# Patient Record
Sex: Male | Born: 1995 | Race: Black or African American | Hispanic: No | Marital: Single | Smoking: Never smoker
Health system: Southern US, Community
[De-identification: ages and names within clinical notes are randomized; demographics above are authoritative.]

## PROBLEM LIST (undated history)

## (undated) DIAGNOSIS — T7840XA Allergy, unspecified, initial encounter: Secondary | ICD-10-CM

## (undated) HISTORY — PX: TONSILLECTOMY: SUR1361

## (undated) HISTORY — DX: Allergy, unspecified, initial encounter: T78.40XA

## (undated) HISTORY — PX: ADENOIDECTOMY: SUR15

## (undated) HISTORY — PX: FOOT SURGERY: SHX648

---

## 2016-10-12 ENCOUNTER — Emergency Department (HOSPITAL_COMMUNITY): Payer: Self-pay

## 2016-10-12 ENCOUNTER — Encounter (HOSPITAL_COMMUNITY): Payer: Self-pay | Admitting: Family Medicine

## 2016-10-12 ENCOUNTER — Emergency Department (HOSPITAL_COMMUNITY)
Admission: EM | Admit: 2016-10-12 | Discharge: 2016-10-13 | Disposition: A | Discharge status: Home / Self Care | Payer: Self-pay | Attending: Emergency Medicine | Admitting: Emergency Medicine

## 2016-10-12 DIAGNOSIS — J069 Acute upper respiratory infection, unspecified: Secondary | ICD-10-CM | POA: Insufficient documentation

## 2016-10-12 LAB — RAPID STREP SCREEN (MED CTR MEBANE ONLY): STREPTOCOCCUS, GROUP A SCREEN (DIRECT): NEGATIVE

## 2016-10-12 MED ORDER — KETOROLAC TROMETHAMINE 30 MG/ML IJ SOLN
15.0000 mg | Freq: Once | INTRAMUSCULAR | Status: AC
  Administered 2016-10-13: 15 mg via INTRAMUSCULAR
  Filled 2016-10-12: qty 1

## 2016-10-12 NOTE — ED Triage Notes (Signed)
Pt reports he has had a sore throat since Saturday and rash on his upper lip started today. Also, reports fever (102.0 oral)  at home. Took OTC cough drops, throat spray, and cold medication.

## 2016-10-12 NOTE — ED Provider Notes (Signed)
[facility]-EMERGENCY DEPT Provider Note   CSN: [id number] Arrival date & time: 10/12/16  2231   By signing my name below, I, Teofilo Pod, attest that this documentation has been prepared under the direction and in the presence of Shon Baton, MD . Electronically Signed: Teofilo Pod, ED Scribe. 10/12/2016. 11:20 PM.   History   Chief Complaint Chief Complaint  Patient presents with  . Sore Throat  . Rash    The history is provided by the patient. No language interpreter was used.   HPI Comments:  Steven Franco is a 20 y.o. male who presents to the Emergency Department complaining of a constant sore throat x 4 days. Pt reports that he feels like his throat is closing up and that he is having SOB. Pt complains of associated fever of 102, diaphoresis at night, and rhinorrhea. Pt also complains of a rash around his mouth. Pt states that he has had similar fever blisters previously. Pt reports PSHx of tonsillectomy Pt took Robitussin DM, cough spray, and cough drops with no relief. Pt denies nausea, vomiting, ear pain.    History reviewed. No pertinent past medical history.  There are no active problems to display for this patient.   Past Surgical History:  Procedure Laterality Date  . ADENOIDECTOMY    . FOOT SURGERY Left   . TONSILLECTOMY         Home Medications    Prior to Admission medications   Medication Sig Start Date End Date Taking? Authorizing Provider  ibuprofen (ADVIL,MOTRIN) 600 MG tablet Take 1 tablet (600 mg total) by mouth every 6 (six) hours as needed. 10/13/16   Shon Baton, MD    Family History History reviewed. No pertinent family history.  Social History Social History  Substance Use Topics  . Smoking status: Never Smoker  . Smokeless tobacco: Never Used  . Alcohol use Yes     Comment: Every other week      Allergies   Review of patient's allergies indicates no known allergies.   Review of Systems Review of Systems    Constitutional: Positive for diaphoresis and fever.  HENT: Positive for rhinorrhea and sore throat. Negative for ear pain.   Gastrointestinal: Negative for nausea and vomiting.  Skin: Positive for rash.  All other systems reviewed and are negative.    Physical Exam Updated Vital Signs BP 127/58 (BP Location: Right Arm)   Pulse 71   Temp 98.5 F (36.9 C) (Oral)   Resp 18   Ht 5\' 6"  (1.676 m)   Wt 150 lb (68 kg)   SpO2 100%   BMI 24.21 kg/m   Physical Exam  Constitutional: He is oriented to person, place, and time. He appears well-developed and well-nourished. No distress.  HENT:  Head: Normocephalic and atraumatic.  Fever blisters noted about the mouth, status post tonsillectomy, erythema posterior oropharynx, uvula midline  Eyes: Pupils are equal, round, and reactive to light.  Neck: Normal range of motion. Neck supple.  Cardiovascular: Normal rate, regular rhythm and normal heart sounds.   No murmur heard. Pulmonary/Chest: Effort normal and breath sounds normal. No respiratory distress. He has no wheezes.  Abdominal: Soft. Bowel sounds are normal. There is no tenderness. There is no rebound.  Musculoskeletal: He exhibits no edema.  Neurological: He is alert and oriented to person, place, and time.  Skin: Skin is warm and dry.  Psychiatric: He has a normal mood and affect.  Nursing note and vitals reviewed.  ED Treatments / Results  DIAGNOSTIC STUDIES:  Oxygen Saturation is 100% on RA, normal by my interpretation.    COORDINATION OF CARE:  11:20 PM Discussed treatment plan with pt at bedside and pt agreed to plan.   Labs (all labs ordered are listed, but only abnormal results are displayed) Labs Reviewed  [facility]EN [facility])  CULTURE, GROUP A STREP [facility])    EKG  EKG Interpretation None       Radiology Dg Chest 2 View  Result Date: 10/12/2016 CLINICAL DATA:  Sore throat, congestion, fever and perioral rash for 4 days. EXAM: CHEST  2  VIEW COMPARISON:  None. FINDINGS: Cardiomediastinal silhouette is normal. No pleural effusions or focal consolidations. Trachea projects midline and there is no pneumothorax. Soft tissue planes and included osseous structures are non-suspicious. Mild thoracolumbar dextroscoliosis may be positional. IMPRESSION: Normal chest. Electronically Signed   By: Awilda Metroourtnay  Bloomer M.D.   On: 10/12/2016 23:56    Procedures Procedures (including critical care time)  Medications Ordered in ED Medications  ketorolac (TORADOL) 30 MG/ML injection 15 mg (not administered)     Initial Impression / Assessment and Plan / ED Course  I have reviewed the triage vital signs and the nursing notes.  Pertinent labs & imaging results that were available during my care of the patient were reviewed by me and considered in my medical decision making (see chart for details).  Clinical Course    Patient presents with cough, fever, sore throat. Nontoxic on exam. Vital signs reassuring. Exam is relatively benign. He does have evidence of the fever blisters about the mouth. Oropharyngeal exam is reassuring. Strep screen negative. Chest x-ray is negative for pneumonia. Suspect viral upper respiratory infection. Discussed with patient supportive measures.  After history, exam, and medical workup I feel the patient has been appropriately medically screened and is safe for discharge home. Pertinent diagnoses were discussed with the patient. Patient was given return precautions.   Final Clinical Impressions(s) / ED Diagnoses   Final diagnoses:  Viral upper respiratory tract infection    New Prescriptions New Prescriptions   IBUPROFEN (ADVIL,MOTRIN) 600 MG TABLET    Take 1 tablet (600 mg total) by mouth every 6 (six) hours as needed.   I personally performed the services described in this documentation, which was scribed in my presence. The recorded information has been reviewed and is accurate.     Shon Batonourtney F Bernise Sylvain,  MD 10/13/16 [phone]

## 2016-10-13 MED ORDER — IBUPROFEN 600 MG PO TABS: 600.0000 mg | ORAL_TABLET | Freq: Four times a day (QID) | ORAL | 0 refills | Status: DC | PRN

## 2016-10-13 NOTE — Discharge Instructions (Signed)
You were seen today for sore throat, cough, fever. Your chest x-ray and strep screen are negative. He likely have a virus. Supportive measures at home including good hydration, Motrin for fever and pain.

## 2016-10-15 LAB — CULTURE, GROUP A STREP (THRC)

## 2016-10-22 ENCOUNTER — Emergency Department (HOSPITAL_COMMUNITY): Payer: Self-pay

## 2016-10-22 ENCOUNTER — Encounter (HOSPITAL_COMMUNITY): Payer: Self-pay | Admitting: *Deleted

## 2016-10-22 ENCOUNTER — Emergency Department (HOSPITAL_COMMUNITY)
Admission: EM | Admit: 2016-10-22 | Discharge: 2016-10-22 | Disposition: A | Discharge status: Home / Self Care | Payer: Self-pay | Attending: Emergency Medicine | Admitting: Emergency Medicine

## 2016-10-22 DIAGNOSIS — J329 Chronic sinusitis, unspecified: Secondary | ICD-10-CM | POA: Insufficient documentation

## 2016-10-22 LAB — RAPID STREP SCREEN (MED CTR MEBANE ONLY): STREPTOCOCCUS, GROUP A SCREEN (DIRECT): NEGATIVE

## 2016-10-22 MED ORDER — AMOXICILLIN 500 MG PO CAPS: 1000.0000 mg | ORAL_CAPSULE | Freq: Two times a day (BID) | ORAL | 0 refills | Status: DC

## 2016-10-22 MED ORDER — AMOXICILLIN 500 MG PO CAPS: 1000.0000 mg | ORAL_CAPSULE | Freq: Once | ORAL | Status: DC

## 2016-10-22 NOTE — Discharge Instructions (Signed)
You can use Flonase nasal spray which is avilable over the counter. Use nasal saline (you can try [employer] Simply Saline) at least 4 times a day, use saline 5-10 minutes before using the fluticasone (flonase) nasal spray ° °Do not use Afrin (Oxymetazoline) ° °Rest, wash hands frequently  and drink plenty of water. ° °You may try over the counter medication such as allegra-decongestant or claritin-decongestant and/or Mucinex or decongestants. ° °Push fluids to try to thin the mucus and get it to flow out the nose.  ° °Do not hesitate to return to the emergency room for any new, worsening or concerning symptoms. ° °Please obtain primary care using resource guide below. Let them know that you were seen in the emergency room and that they will need to obtain records for further outpatient management. ° ° ° °

## 2016-10-22 NOTE — ED Provider Notes (Signed)
[facility]-EMERGENCY DEPT Provider Note   CSN: [id number] Arrival date & time: 10/22/16  2008  By signing my name below, I, Emmanuella Mensah, attest that this documentation has been prepared under the direction and in the presence of [employer] Sheniya Garciaperez, PA-C. Electronically Signed: Angelene GiovanniEmmanuella Mensah, ED Scribe. 10/22/16. 8:59 PM.   History   Chief Complaint Chief Complaint  Patient presents with  . URI   HPI Comments: Steven Franco is a 20 y.o. male who presents to the Emergency Department complaining of gradually worsening non-productive cough onset 2 weeks ago. He reports associated intermittent subjective fever/chills, sore throat and nasal congestion. Pt was seen on 10/12/16 for these symptoms where he was discharged with ibuprofen after negative chest x-ray and strep screen. He states that he is here today because his symptoms are worsening despite treatment. No other alleviating factors noted. He denies any nausea, vomiting, shortness of breath, or any other symptoms.   The history is provided by the patient. No language interpreter was used.    History reviewed. No pertinent past medical history.  There are no active problems to display for this patient.   Past Surgical History:  Procedure Laterality Date  . ADENOIDECTOMY    . FOOT SURGERY Left   . TONSILLECTOMY         Home Medications    Prior to Admission medications   Medication Sig Start Date End Date Taking? Authorizing Provider  amoxicillin (AMOXIL) 500 MG capsule Take 2 capsules (1,000 mg total) by mouth 2 (two) times daily. 10/22/16   Nikai Quest, PA-C  ibuprofen (ADVIL,MOTRIN) 600 MG tablet Take 1 tablet (600 mg total) by mouth every 6 (six) hours as needed. 10/13/16   Shon Batonourtney F Horton, MD    Family History No family history on file.  Social History Social History  Substance Use Topics  . Smoking status: Never Smoker  . Smokeless tobacco: Never Used  . Alcohol use Yes     Comment: Every other week       Allergies   Review of patient's allergies indicates no known allergies.   Review of Systems Review of Systems  A complete 10 system review of systems was obtained and all systems are negative except as noted in the HPI and PMH.   Physical Exam Updated Vital Signs BP 111/78 (BP Location: Right Arm)   Pulse 87   Temp 98.3 F (36.8 C) (Oral)   Resp 18   Ht 5\' 6"  (1.676 m)   Wt 65.8 kg   SpO2 100%   BMI 23.40 kg/m   Physical Exam  Constitutional: He is oriented to person, place, and time. He appears well-developed and well-nourished.  HENT:  Head: Normocephalic and atraumatic.  Right Ear: External ear normal.  Left Ear: External ear normal.  Mouth/Throat: Oropharynx is clear and moist. No oropharyngeal exudate.  No drooling or stridor. Posterior pharynx mildly erythematous no significant tonsillar hypertrophy. No exudate. Soft palate rises symmetrically. No TTP or induration under tongue.   No tenderness to palpation of frontal or bilateral maxillary sinuses.  Mild mucosal edema in the nares with scant rhinorrhea.  Bilateral tympanic membranes with normal architecture and good light reflex.    Eyes: Conjunctivae and EOM are normal. Pupils are equal, round, and reactive to light.  Neck: Normal range of motion. Neck supple.  Cardiovascular: Normal rate and regular rhythm.   Pulmonary/Chest: Effort normal and breath sounds normal. No stridor. No respiratory distress. He has no wheezes. He has no rales. He exhibits no tenderness.  Abdominal: Soft. There is no tenderness. There is no rebound and no guarding.  Neurological: He is alert and oriented to person, place, and time.  Skin: Skin is warm and dry.  Psychiatric: He has a normal mood and affect.  Nursing note and vitals reviewed.    ED Treatments / Results  DIAGNOSTIC STUDIES: Oxygen Saturation is 100% on RA, normal by my interpretation.    COORDINATION OF CARE: 8:56 PM- Pt advised of plan for treatment and  pt agrees. Pt will receive strep screen and chest x-ray for further evaluation.    Labs (all labs ordered are listed, but only abnormal results are displayed) Labs Reviewed  RAPID STREP SCREEN (NOT AT [facility])  CULTURE, GROUP A STREP [facility])    EKG  EKG Interpretation None       Radiology Dg Chest 2 View  Result Date: 10/22/2016 CLINICAL DATA:  Initial evaluation for acute cough, shortness of breath. EXAM: CHEST  2 VIEW COMPARISON:  Prior radiograph from 10/12/2016. FINDINGS: The cardiac and mediastinal silhouettes are stable in size and contour, and remain within normal limits. The lungs are normally inflated. No airspace consolidation, pleural effusion, or pulmonary edema is identified. There is no pneumothorax. No acute osseous abnormality identified. IMPRESSION: No active cardiopulmonary disease. Electronically Signed   By: Rise MuBenjamin  McClintock M.D.   On: 10/22/2016 21:15    Procedures Procedures (including critical care time)  Medications Ordered in ED Medications  amoxicillin (AMOXIL) capsule 1,000 mg (1,000 mg Oral Not Given 10/22/16 2142)     Initial Impression / Assessment and Plan / ED Course  Wynetta EmeryNicole Monette Omara, PA-C has reviewed the triage vital signs and the nursing notes.  Pertinent labs & imaging results that were available during my care of the patient were reviewed by me and considered in my medical decision making (see chart for details).  Clinical Course   Vitals:   10/22/16 2021  BP: 111/78  Pulse: 87  Resp: 18  Temp: 98.3 F (36.8 C)  TempSrc: Oral  SpO2: 100%  Weight: 65.8 kg  Height: 5\' 6"  (1.676 m)    Medications  amoxicillin (AMOXIL) capsule 1,000 mg (1,000 mg Oral Not Given 10/22/16 2142)    Steven Franco is 20 y.o. male presenting with Persistent upper respiratory symptoms in addition to cough onset 2 weeks ago. Lung sounds clear to auscultation, chest x-ray negative, rapid strep is negative, given the length of times of his symptoms will start  him on amoxicillin for sinusitis.  Evaluation does not show pathology that would require ongoing emergent intervention or inpatient treatment. Pt is hemodynamically stable and mentating appropriately. Discussed findings and plan with patient/guardian, who agrees with care plan. All questions answered. Return precautions discussed and outpatient follow up given.      Final Clinical Impressions(s) / ED Diagnoses   Final diagnoses:  Sinusitis, unspecified chronicity, unspecified location    New Prescriptions Discharge Medication List as of 10/22/2016  9:38 PM    START taking these medications   Details  amoxicillin (AMOXIL) 500 MG capsule Take 2 capsules (1,000 mg total) by mouth 2 (two) times daily., Starting Thu 10/22/2016, Print       I personally performed the services described in this documentation, which was scribed in my presence. The recorded information has been reviewed and is accurate.    Wynetta Emeryicole Royelle Hinchman, PA-C 10/22/16 2209    Rolland PorterMark James, MD 10/31/16 [phone]

## 2016-10-22 NOTE — ED Triage Notes (Signed)
Pt complains of cough, sore throat, nasal congestion for the past 2 weeks. Pt was seen 2 weeks ago for same but states he does not feel better. Pt denies fever.

## 2016-10-22 NOTE — ED Notes (Signed)
Patient is alert and oriented x3.  He was given DC instructions and follow up visit instructions.  Patient gave verbal understanding.  He was DC ambulatory under his own power to home.  V/S stable.  He was not showing any signs of distress on DC 

## 2016-10-25 LAB — CULTURE, GROUP A STREP (THRC)

## 2017-10-22 ENCOUNTER — Ambulatory Visit: Payer: Self-pay | Admitting: Emergency Medicine

## 2017-10-26 ENCOUNTER — Encounter: Payer: Self-pay | Admitting: Emergency Medicine

## 2017-10-26 ENCOUNTER — Ambulatory Visit (INDEPENDENT_AMBULATORY_CARE_PROVIDER_SITE_OTHER): Payer: BLUE CROSS/BLUE SHIELD | Admitting: Emergency Medicine

## 2017-10-26 VITALS — BP 100/60 | HR 61 | Temp 98.3°F | Resp 16 | Ht 64.0 in | Wt 151.6 lb

## 2017-10-26 DIAGNOSIS — F4323 Adjustment disorder with mixed anxiety and depressed mood: Secondary | ICD-10-CM

## 2017-10-26 MED ORDER — SERTRALINE HCL 50 MG PO TABS: 50.0000 mg | ORAL_TABLET | Freq: Every day | ORAL | 3 refills | Status: AC

## 2017-10-26 NOTE — Patient Instructions (Addendum)
   IF you received an x-ray today, you will receive an invoice from [facility]. Please contact [facility] at [phone] with questions or concerns regarding your invoice.   IF you received labwork today, you will receive an invoice from [facility]. Please contact [facility] at [phone] with questions or concerns regarding your invoice.   Our billing staff will not be able to assist you with questions regarding bills from these companies.  You will be contacted with the lab results as soon as they are available. The fastest way to get your results is to activate your My Chart account. Instructions are located on the last page of this paperwork. If you have not heard from us regarding the results in 2 weeks, please contact this office.      Major Depressive Disorder, Adult Major depressive disorder (MDD) is a mental health condition. MDD often makes you feel sad, hopeless, or helpless. MDD can also cause symptoms in your body. MDD can affect your:  Work.  School.  Relationships.  Other normal activities.  MDD can range from mild to very bad. It may occur once (single episode MDD). It can also occur many times (recurrent MDD). The main symptoms of MDD often include:  Feeling sad, depressed, or irritable most of the time.  Loss of interest.  MDD symptoms also include:  Sleeping too much or too little.  Eating too much or too little.  A change in your weight.  Feeling tired (fatigue) or having low energy.  Feeling worthless.  Feeling guilty.  Trouble making decisions.  Trouble thinking clearly.  Thoughts of suicide or harming others.  Feeling weak.  Feeling agitated.  Keeping yourself from being around other people (isolation).  Follow these instructions at home: Activity  Do these things as told by your doctor: ? Go back to your normal activities. ? Exercise regularly. ? Spend time outdoors. Alcohol  Talk with your doctor about  how alcohol can affect your antidepressant medicines.  Do not drink alcohol. Or, limit how much alcohol you drink. ? This means no more than 1 drink a day for nonpregnant women and 2 drinks a day for men. One drink equals one of these:  12 oz of beer.  5 oz of wine.  1 oz of hard liquor. General instructions  Take over-the-counter and prescription medicines only as told by your doctor.  Eat a healthy diet.  Get plenty of sleep.  Find activities that you enjoy. Make time to do them.  Think about joining a support group. Your doctor may be able to suggest a group for you.  Keep all follow-up visits as told by your doctor. This is important. Where to find more information:  National Alliance on Mental Illness: ? www.nami.org  [place] National Institute of Mental Health: ? www.nimh.nih.gov  National Suicide Prevention Lifeline: ? 1-800-273-8255. This is free, 24-hour help. Contact a doctor if:  Your symptoms get worse.  You have new symptoms. Get help right away if:  You self-harm.  You see, hear, taste, smell, or feel things that are not present (hallucinate). If you ever feel like you may hurt yourself or others, or have thoughts about taking your own life, get help right away. You can go to your nearest emergency department or call:  Your local emergency services (911 in the [place]  A suicide crisis helpline, such as the National Suicide Prevention Lifeline: ? 1-800-273-8255. This is open 24 hours a day.  This information is not intended to replace   advice given to you by your health care provider. Make sure you discuss any questions you have with your health care provider. Document Released: 11/18/2015 Document Revised: 08/23/2016 Document Reviewed: 08/23/2016 Elsevier Interactive Patient Education  2017 [employer]  

## 2017-10-26 NOTE — Progress Notes (Signed)
Steven Franco 21 y.o.   Chief Complaint  Patient presents with  . Anxiety    x 2 weeks - referred from [employer] counselor, per patient unable to get an erection    HISTORY OF PRESENT ILLNESS: This is a [age] y.o. male complaining of extreme anxiety x 2 years since witnessing older brother die while having seizures secondary to CP; on and off episodes but worse the past several weeks; student at [employer]; seen by counselor and referred here for treatment; he spaces out, feels disconnected, starts shaking for no reason, complacent with lack of drive, feels numb, and has no sexual desire with erection problems.  HPI   Prior to Admission medications   Not on File    No Known Allergies  There are no active problems to display for this patient.   Past Medical History:  Diagnosis Date  . Allergy     Past Surgical History:  Procedure Laterality Date  . ADENOIDECTOMY    . FOOT SURGERY Left   . TONSILLECTOMY      Social History   Socioeconomic History  . Marital status: Single    Spouse name: Not on file  . Number of children: Not on file  . Years of education: Not on file  . Highest education level: Not on file  Social Needs  . Financial resource strain: Not on file  . Food insecurity - worry: Not on file  . Food insecurity - inability: Not on file  . Transportation needs - medical: Not on file  . Transportation needs - non-medical: Not on file  Occupational History  . Not on file  Tobacco Use  . Smoking status: Never Smoker  . Smokeless tobacco: Never Used  Substance and Sexual Activity  . Alcohol use: Yes    Alcohol/week: 1.8 oz    Types: 3 Shots of liquor per week    Comment: Every other week   . Drug use: Yes    Frequency: 3.0 times per week    Types: Marijuana  . Sexual activity: Not on file  Other Topics Concern  . Not on file  Social History Narrative  . Not on file    Family History  Problem Relation Age of Onset  . Diabetes Mother   . Mental retardation  Mother   . Mental retardation Brother      Review of Systems  Constitutional: Negative.  Negative for chills, fever and weight loss.  HENT: Negative.  Negative for hearing loss and sore throat.   Eyes: Negative.  Negative for blurred vision, discharge and redness.  Respiratory: Negative.  Negative for cough, shortness of breath and wheezing.   Cardiovascular: Negative.  Negative for chest pain, palpitations and leg swelling.  Gastrointestinal: Negative for abdominal pain, blood in stool, diarrhea, melena, nausea and vomiting.  Genitourinary: Negative.  Negative for dysuria, flank pain, frequency and hematuria.  Musculoskeletal: Negative for back pain and neck pain.  Skin: Negative.  Negative for rash.  Neurological: Negative.  Negative for dizziness, sensory change, focal weakness and headaches.  Endo/Heme/Allergies: Negative.   All other systems reviewed and are negative.   Vitals:   10/26/17 1322  BP: 100/60  Pulse: 61  Resp: 16  Temp: 98.3 F (36.8 C)  SpO2: 98%    Physical Exam  Constitutional: He is oriented to person, place, and time. He appears well-developed and well-nourished.  HENT:  Head: Normocephalic and atraumatic.  Nose: Nose normal.  Mouth/Throat: Oropharynx is clear and moist.  Eyes: Conjunctivae and  EOM are normal. Pupils are equal, round, and reactive to light.  Neck: Normal range of motion. Neck supple. No JVD present.  Cardiovascular: Normal rate, regular rhythm, normal heart sounds and intact distal pulses.  Pulmonary/Chest: Effort normal and breath sounds normal.  Abdominal: Soft. Bowel sounds are normal. He exhibits no distension. There is no tenderness.  Musculoskeletal: Normal range of motion.  Lymphadenopathy:    He has no cervical adenopathy.  Neurological: He is alert and oriented to person, place, and time. No sensory deficit. He exhibits normal muscle tone.  Skin: Skin is warm and dry. Capillary refill takes less than 2 seconds.    Psychiatric: He has a normal mood and affect. His behavior is normal.  Vitals reviewed.    ASSESSMENT & PLAN:  Steven Franco was seen today for anxiety.  Diagnoses and all orders for this visit:  Adjustment reaction with anxiety and depression -     Ambulatory referral to Psychiatry -     CBC with Differential/Platelet -     Comprehensive metabolic panel  Other orders -     sertraline (ZOLOFT) 50 MG tablet; Take 1 tablet (50 mg total) daily by mouth.    Patient Instructions       IF you received an x-ray today, you will receive an invoice from [facility]. Please contact [facility] at [phone] with questions or concerns regarding your invoice.   IF you received labwork today, you will receive an invoice from [facility]. Please contact [facility] at [phone] with questions or concerns regarding your invoice.   Our billing staff will not be able to assist you with questions regarding bills from these companies.  You will be contacted with the lab results as soon as they are available. The fastest way to get your results is to activate your My Chart account. Instructions are located on the last page of this paperwork. If you have not heard from [place] regarding the results in 2 weeks, please contact this office.     Major Depressive Disorder, Adult Major depressive disorder (MDD) is a mental health condition. MDD often makes you feel sad, hopeless, or helpless. MDD can also cause symptoms in your body. MDD can affect your:  Work.  School.  Relationships.  Other normal activities.  MDD can range from mild to very bad. It may occur once (single episode MDD). It can also occur many times (recurrent MDD). The main symptoms of MDD often include:  Feeling sad, depressed, or irritable most of the time.  Loss of interest.  MDD symptoms also include:  Sleeping too much or too little.  Eating too much or too little.  A change in your weight.  Feeling  tired (fatigue) or having low energy.  Feeling worthless.  Feeling guilty.  Trouble making decisions.  Trouble thinking clearly.  Thoughts of suicide or harming others.  Feeling weak.  Feeling agitated.  Keeping yourself from being around other people (isolation).  Follow these instructions at home: Activity  Do these things as told by your doctor: ? Go back to your normal activities. ? Exercise regularly. ? Spend time outdoors. Alcohol  Talk with your doctor about how alcohol can affect your antidepressant medicines.  Do not drink alcohol. Or, limit how much alcohol you drink. ? This means no more than 1 drink a day for nonpregnant women and 2 drinks a day for men. One drink equals one of these:  12 oz of beer.  5 oz of wine.  1 oz of hard  liquor. General instructions  Take over-the-counter and prescription medicines only as told by your doctor.  Eat a healthy diet.  Get plenty of sleep.  Find activities that you enjoy. Make time to do them.  Think about joining a support group. Your doctor may be able to suggest a group for you.  Keep all follow-up visits as told by your doctor. This is important. Where to find more information:  The [employer] on Mental Illness: ? www.nami.org  U.[employer] of Mental Health: ? http://www.maynard.net/www.nimh.nih.gov  National Suicide Prevention Lifeline: ? 818-049-89761-806 061 7548. This is free, 24-hour help. Contact a doctor if:  Your symptoms get worse.  You have new symptoms. Get help right away if:  You self-harm.  You see, hear, taste, smell, or feel things that are not present (hallucinate). If you ever feel like you may hurt yourself or others, or have thoughts about taking your own life, get help right away. You can go to your nearest emergency department or call:  Your local emergency services (911 in the [place]  A suicide crisis helpline, such as the National Suicide Prevention Lifeline: ? 318-469-41341-806 061 7548. This is  open 24 hours a day.  This information is not intended to replace advice given to you by your health care provider. Make sure you discuss any questions you have with your health care provider. Document Released: 11/18/2015 Document Revised: 08/23/2016 Document Reviewed: 08/23/2016 Elsevier Interactive Patient Education  2017 Elsevier Inc.     Edwina BarthMiguel Ronell Boldin, MD [facility]

## 2017-10-27 ENCOUNTER — Encounter: Payer: Self-pay | Admitting: Radiology

## 2017-10-27 LAB — COMPREHENSIVE METABOLIC PANEL
A/G RATIO: 1.8 (ref 1.2–2.2)
ALT: 10 IU/L (ref 0–44)
AST: 21 IU/L (ref 0–40)
Albumin: 4.8 g/dL (ref 3.5–5.5)
Alkaline Phosphatase: 78 IU/L (ref 39–117)
BUN/Creatinine Ratio: 8 — ABNORMAL LOW (ref 9–20)
BUN: 8 mg/dL (ref 6–20)
Bilirubin Total: 0.5 mg/dL (ref 0.0–1.2)
CALCIUM: 9.7 mg/dL (ref 8.7–10.2)
CO2: 24 mmol/L (ref 20–29)
CREATININE: 1.05 mg/dL (ref 0.76–1.27)
Chloride: 102 mmol/L (ref 96–106)
GFR, EST AFRICAN AMERICAN: 116 mL/min/{1.73_m2} (ref >59)
GFR, EST NON AFRICAN AMERICAN: 100 mL/min/{1.73_m2} (ref >59)
GLUCOSE: 83 mg/dL (ref 65–99)
Globulin, Total: 2.7 g/dL (ref 1.5–4.5)
Potassium: 4.6 mmol/L (ref 3.5–5.2)
Sodium: 141 mmol/L (ref 134–144)
TOTAL PROTEIN: 7.5 g/dL (ref 6.0–8.5)

## 2017-10-27 LAB — CBC WITH DIFFERENTIAL/PLATELET
BASOS: 0 %
Basophils Absolute: 0 10*3/uL (ref 0.0–0.2)
EOS (ABSOLUTE): 0.1 10*3/uL (ref 0.0–0.4)
Eos: 1 %
Hematocrit: 47 % (ref 37.5–51.0)
Hemoglobin: 16 g/dL (ref 13.0–17.7)
IMMATURE GRANS (ABS): 0 10*3/uL (ref 0.0–0.1)
IMMATURE GRANULOCYTES: 0 %
LYMPHS: 31 %
Lymphocytes Absolute: 2.5 10*3/uL (ref 0.7–3.1)
MCH: 31.5 pg (ref 26.6–33.0)
MCHC: 34 g/dL (ref 31.5–35.7)
MCV: 93 fL (ref 79–97)
Monocytes Absolute: 0.5 10*3/uL (ref 0.1–0.9)
Monocytes: 6 %
NEUTROS PCT: 62 %
Neutrophils Absolute: 4.9 10*3/uL (ref 1.4–7.0)
PLATELETS: 253 10*3/uL (ref 150–379)
RBC: 5.08 x10E6/uL (ref 4.14–5.80)
RDW: 14.6 % (ref 12.3–15.4)
WBC: 8 10*3/uL (ref 3.4–10.8)

## 2018-04-12 IMAGING — CR DG CHEST 2V
2 series · 2 of 2 positions shown · non-contrast
Comparison: Prior radiograph from 10/12/2016.

CLINICAL DATA: Initial evaluation for acute cough, shortness of
breath.

EXAM:
CHEST  2 VIEW

[w chest pa]
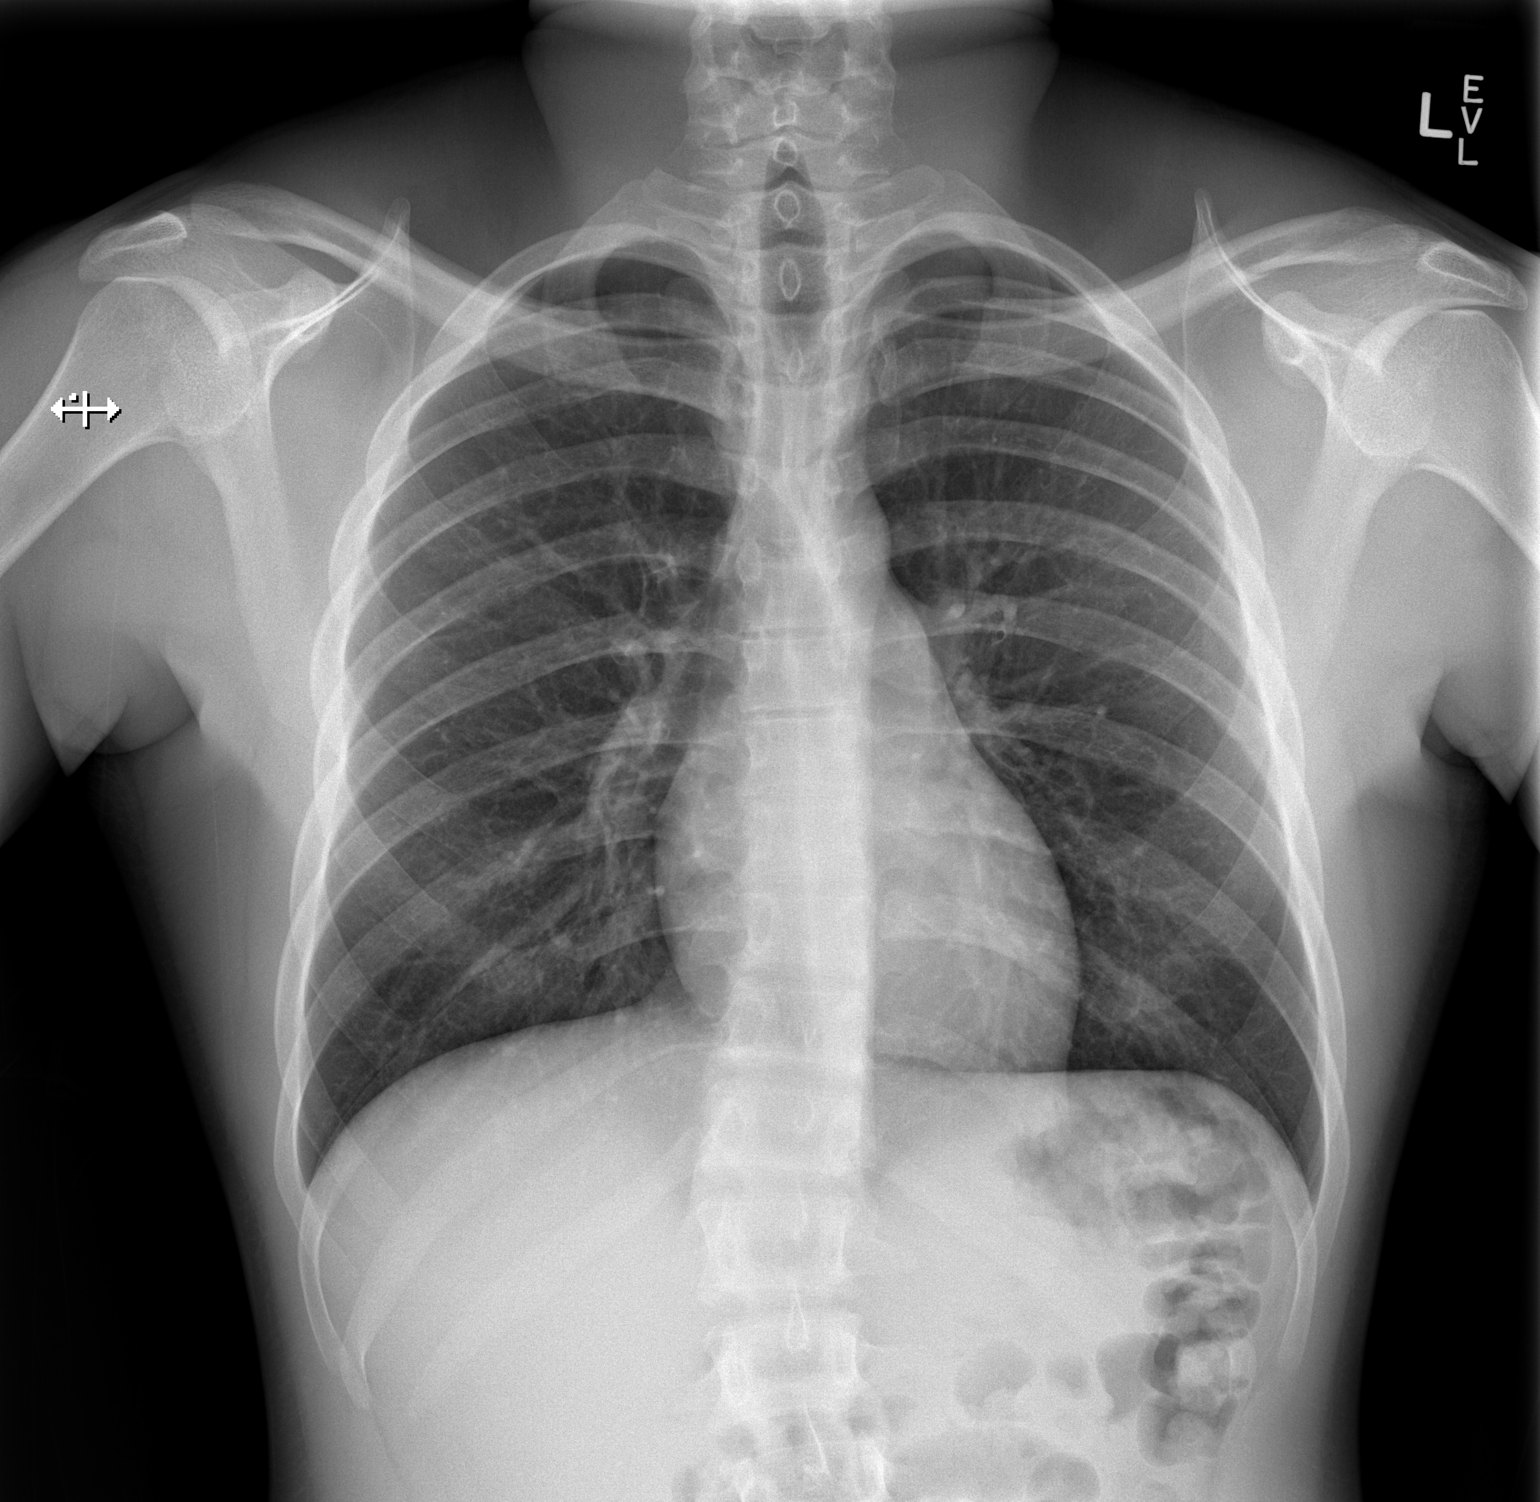

[w chest lat]
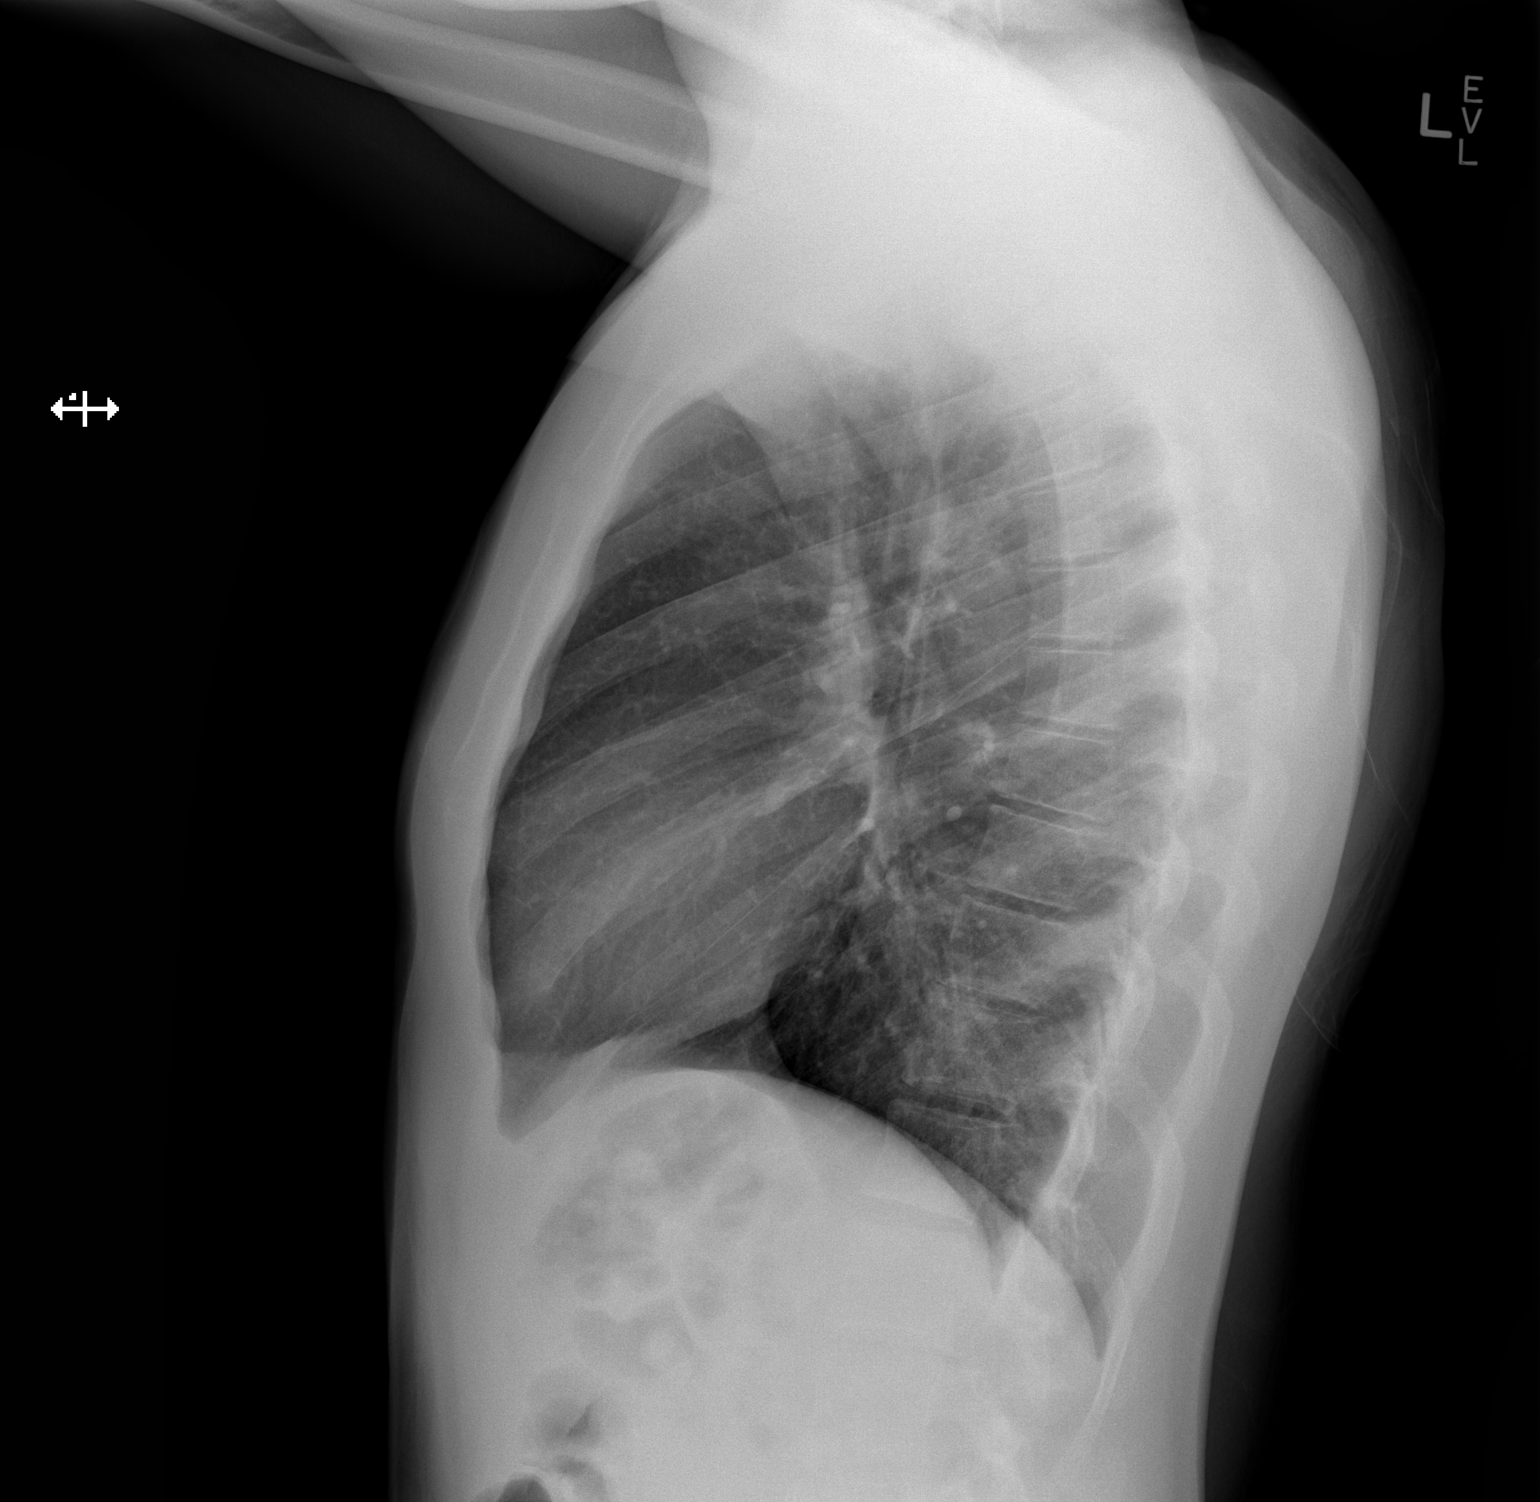

[2 of 2 positions shown; findings below may reference images not displayed]

FINDINGS: The cardiac and mediastinal silhouettes are stable in size and
contour, and remain within normal limits.

The lungs are normally inflated. No airspace consolidation, pleural
effusion, or pulmonary edema is identified. There is no
pneumothorax.

No acute osseous abnormality identified.
IMPRESSION: No active cardiopulmonary disease.

## 2019-02-27 ENCOUNTER — Emergency Department (HOSPITAL_COMMUNITY)
Admission: EM | Admit: 2019-02-27 | Discharge: 2019-02-28 | Disposition: A | Discharge status: Home / Self Care | Payer: Self-pay | Attending: Emergency Medicine | Admitting: Emergency Medicine

## 2019-02-27 ENCOUNTER — Emergency Department (HOSPITAL_COMMUNITY): Payer: Self-pay

## 2019-02-27 ENCOUNTER — Encounter (HOSPITAL_COMMUNITY): Payer: Self-pay

## 2019-02-27 DIAGNOSIS — Y9389 Activity, other specified: Secondary | ICD-10-CM | POA: Insufficient documentation

## 2019-02-27 DIAGNOSIS — Y999 Unspecified external cause status: Secondary | ICD-10-CM | POA: Insufficient documentation

## 2019-02-27 DIAGNOSIS — S62336A Displaced fracture of neck of fifth metacarpal bone, right hand, initial encounter for closed fracture: Secondary | ICD-10-CM | POA: Insufficient documentation

## 2019-02-27 DIAGNOSIS — Y929 Unspecified place or not applicable: Secondary | ICD-10-CM | POA: Insufficient documentation
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
## 2019-02-28 NOTE — Discharge Instructions (Addendum)
IMPRESSION:  Mildly comminuted and angulated extra-articular fracture of the  right 5th metacarpal.   You were seen in the ER for right hand pain.  X-ray shows a fracture of the metacarpal of the fifth digit.  This was placed in an ulnar gutter splint.  Keep this clean and dry.  Elevate, ice.  Use ibuprofen or acetaminophen for pain.  Call hand clinic tomorrow to schedule an appointment for appropriate follow-up.  Return to the ER if there is any loss of sensation, tingling to your fingertip.
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
## 2019-02-28 NOTE — ED Notes (Signed)
Called Ortho Tech @0109  for  Ulnar Gutter  Splint

## 2019-02-28 NOTE — Progress Notes (Signed)
Orthopedic Tech Progress Note Patient Details:  Steven Franco July 24, 1996 [id number]  Ortho Devices Type of Ortho Device: Ulna gutter splint Ortho Device/Splint Location: rue Ortho Device/Splint Interventions: Ordered, Application, Adjustment   Post Interventions Patient Tolerated: Well Instructions Provided: Care of device, Adjustment of device   [name] Post 02/28/2019, 2:28 AM

## 2020-03-15 ENCOUNTER — Encounter (HOSPITAL_COMMUNITY): Payer: Self-pay

## 2020-03-15 ENCOUNTER — Other Ambulatory Visit: Payer: Self-pay

## 2020-03-15 ENCOUNTER — Ambulatory Visit (HOSPITAL_COMMUNITY)
Admission: EM | Admit: 2020-03-15 | Discharge: 2020-03-15 | Disposition: A | Discharge status: Home / Self Care | Payer: Self-pay | Attending: Emergency Medicine | Admitting: Emergency Medicine

## 2020-03-15 DIAGNOSIS — L02411 Cutaneous abscess of right axilla: Secondary | ICD-10-CM

## 2020-03-15 MED ORDER — CEPHALEXIN 500 MG PO CAPS: 500.0000 mg | ORAL_CAPSULE | Freq: Four times a day (QID) | ORAL | 0 refills | Status: AC

## 2020-03-15 NOTE — ED Triage Notes (Signed)
Patient reports he has had a bump under his right arm in the armpit that has been there for years, but three days ago he noticed it needed to be popped. He states he got a lot of purulent discharge from it, but it has continued to grow over the last 2-3 days.

## 2020-03-15 NOTE — Discharge Instructions (Signed)
Keep dressing in place for the next 24 hours. May remove tomorrow.  Then cleanse area daily with soap and water.  Keep covered to keep protected and collect drainage.   Warm compresses every few hours over dressing to promote further drainage.  Complete course of antibiotics.  If symptoms worsen or do not improve in the next week to return to be seen or to follow up with your PCP.

## 2020-03-15 NOTE — ED Provider Notes (Signed)
[facility]    CSN: [id number] Arrival date & time: 03/15/20  2376      History   Chief Complaint Chief Complaint  Patient presents with  . Abscess    HPI Steven Franco is a 24 y.o. male.   Steven Franco presents with complaints of abscess under right axilla. States he noted a "lump" approximately 2-3 months ago which hadn't bothered him. Three days ago however it "came to a head" while in the shower and he was able to express some pus from it. Since then it has increased in size and pain. No further drainage. Denies any previous similar. No known MRSA history. No fevers.    ROS per HPI, negative if not otherwise mentioned.      Past Medical History:  Diagnosis Date  . Allergy     Patient Active Problem List   Diagnosis Date Noted  . Adjustment reaction with anxiety and depression 10/26/2017    Past Surgical History:  Procedure Laterality Date  . ADENOIDECTOMY    . FOOT SURGERY Left   . TONSILLECTOMY         Home Medications    Prior to Admission medications   Medication Sig Start Date End Date Taking? Authorizing Provider  cephALEXin (KEFLEX) 500 MG capsule Take 1 capsule (500 mg total) by mouth 4 (four) times daily for 7 days. 03/15/20 03/22/20  Georgetta Haber, NP  sertraline (ZOLOFT) 50 MG tablet Take 1 tablet (50 mg total) daily by mouth. Patient not taking: Reported on 02/28/2019 10/26/17   Georgina Quint, MD    Family History Family History  Problem Relation Age of Onset  . Diabetes Mother   . Mental retardation Mother   . Mental retardation Brother     Social History Social History   Tobacco Use  . Smoking status: Never Smoker  . Smokeless tobacco: Never Used  Substance Use Topics  . Alcohol use: Yes    Alcohol/week: 3.0 standard drinks    Types: 3 Shots of liquor per week    Comment: Every other week   . Drug use: Yes    Frequency: 3.0 times per week    Types: Marijuana     Allergies   Patient has no known  allergies.   Review of Systems Review of Systems   Physical Exam Triage Vital Signs ED Triage Vitals  Enc Vitals Group     BP 03/15/20 0856 112/71     Pulse Rate 03/15/20 0856 72     Resp 03/15/20 0856 16     Temp 03/15/20 0856 98.7 F (37.1 C)     Temp Source 03/15/20 0856 Oral     SpO2 03/15/20 0856 98 %     Weight 03/15/20 0853 157 lb (71.2 kg)     Height --      Head Circumference --      Peak Flow --      Pain Score 03/15/20 0853 8     Pain Loc --      Pain Edu? --      Excl. in GC? --    No data found.  Updated Vital Signs BP 112/71 (BP Location: Right Arm)   Pulse 72   Temp 98.7 F (37.1 C) (Oral)   Resp 16   Wt 157 lb (71.2 kg)   SpO2 98%   BMI 26.13 kg/m   Visual Acuity Right Eye Distance:   Left Eye Distance:   Bilateral Distance:    Right Eye  Near:   Left Eye Near:    Bilateral Near:     Physical Exam Constitutional:      Appearance: He is well-developed.  Cardiovascular:     Rate and Rhythm: Normal rate.  Pulmonary:     Effort: Pulmonary effort is normal.  Skin:    General: Skin is warm and dry.     Comments: Approximately 1.5 cm abscess to right axilla without drainage; fluctuant and red without surrounding induration   Neurological:     Mental Status: He is alert and oriented to person, place, and time.      UC Treatments / Results  Labs (all labs ordered are listed, but only abnormal results are displayed) Labs Reviewed - No data to display  EKG   Radiology No results found.  Procedures Incision and Drainage  Date/Time: 03/15/2020 10:01 AM Performed by: Zigmund Gottron, NP Authorized by: Zigmund Gottron, NP   Consent:    Consent obtained:  Verbal   Consent given by:  Patient   Risks discussed:  Pain and infection   Alternatives discussed:  Observation, referral and alternative treatment Location:    Type:  Abscess   Size:  1.5   Location:  Upper extremity   Upper extremity location: right axilla    Pre-procedure details:    Skin preparation:  Betadine Anesthesia (see MAR for exact dosages):    Anesthesia method:  Local infiltration   Local anesthetic:  Lidocaine 2% w/o epi Procedure details:    Incision types:  Single straight   Scalpel blade:  11   Wound management:  Probed and deloculated   Drainage:  Purulent   Drainage amount:  Copious   Wound treatment:  Wound left open   Packing materials:  None Post-procedure details:    Patient tolerance of procedure:  Tolerated well, no immediate complications   (including critical care time)  Medications Ordered in UC Medications - No data to display  Initial Impression / Assessment and Plan / UC Course  I have reviewed the triage vital signs and the nursing notes.  Pertinent labs & imaging results that were available during my care of the patient were reviewed by me and considered in my medical decision making (see chart for details).    I&D tolerated well with large output. Keflex initiated. Return precautions provided. Patient verbalized understanding and agreeable to plan.   Final Clinical Impressions(s) / UC Diagnoses   Final diagnoses:  Abscess of axilla, right     Discharge Instructions     Keep dressing in place for the next 24 hours. May remove tomorrow.  Then cleanse area daily with soap and water.  Keep covered to keep protected and collect drainage.   Warm compresses every few hours over dressing to promote further drainage.  Complete course of antibiotics.  If symptoms worsen or do not improve in the next week to return to be seen or to follow up with your PCP.     ED Prescriptions    Medication Sig Dispense Auth. Provider   cephALEXin (KEFLEX) 500 MG capsule Take 1 capsule (500 mg total) by mouth 4 (four) times daily for 7 days. 28 capsule Zigmund Gottron, NP     PDMP not reviewed this encounter.   Zigmund Gottron, NP 03/15/20 1002
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
## 2021-06-17 ENCOUNTER — Other Ambulatory Visit: Payer: Self-pay

## 2021-06-17 ENCOUNTER — Encounter (HOSPITAL_COMMUNITY): Payer: Self-pay

## 2021-06-17 ENCOUNTER — Emergency Department (HOSPITAL_COMMUNITY)
Admission: EM | Admit: 2021-06-17 | Discharge: 2021-06-17 | Disposition: A | Discharge status: Home / Self Care | Payer: No Typology Code available for payment source | Attending: Emergency Medicine | Admitting: Emergency Medicine

## 2021-06-17 DIAGNOSIS — Y92410 Unspecified street and highway as the place of occurrence of the external cause: Secondary | ICD-10-CM | POA: Insufficient documentation

## 2021-06-17 DIAGNOSIS — M545 Low back pain, unspecified: Secondary | ICD-10-CM | POA: Diagnosis present

## 2021-06-17 DIAGNOSIS — R202 Paresthesia of skin: Secondary | ICD-10-CM | POA: Diagnosis not present

## 2021-06-17 DIAGNOSIS — M5416 Radiculopathy, lumbar region: Secondary | ICD-10-CM

## 2021-06-17 MED ORDER — PREDNISONE 50 MG PO TABS
50.0000 mg | ORAL_TABLET | Freq: Once | ORAL | Status: AC
  Administered 2021-06-17: 50 mg via ORAL
  Filled 2021-06-17: qty 1

## 2021-06-17 MED ORDER — LIDOCAINE 5 % EX PTCH
2.0000 | MEDICATED_PATCH | CUTANEOUS | Status: DC
  Administered 2021-06-17: 2 via TRANSDERMAL
  Filled 2021-06-17: qty 2

## 2021-06-17 MED ORDER — PREDNISONE 50 MG PO TABS: 50.0000 mg | ORAL_TABLET | Freq: Every day | ORAL | 0 refills | Status: AC

## 2021-06-17 MED ORDER — LIDOCAINE 5 % EX PTCH: 1.0000 | MEDICATED_PATCH | CUTANEOUS | 0 refills | Status: AC

## 2021-06-17 MED ORDER — IBUPROFEN 800 MG PO TABS
800.0000 mg | ORAL_TABLET | Freq: Once | ORAL | Status: AC
  Administered 2021-06-17: 800 mg via ORAL
  Filled 2021-06-17: qty 1

## 2021-06-17 MED ORDER — METHOCARBAMOL 500 MG PO TABS: 500.0000 mg | ORAL_TABLET | Freq: Two times a day (BID) | ORAL | 0 refills | Status: AC | PRN
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
## 2021-11-05 ENCOUNTER — Ambulatory Visit: Payer: Medicaid Other | Admitting: Family Medicine
